# Patient Record
Sex: Female | Born: 2000
Health system: Southern US, Community
[De-identification: ages and names within clinical notes are randomized; demographics above are authoritative.]

---
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
## 2021-01-05 ENCOUNTER — Ambulatory Visit: Payer: Commercial Managed Care - PPO | Admitting: Physical Therapy

## 2021-01-05 ENCOUNTER — Encounter: Payer: Self-pay | Admitting: Physical Therapy

## 2021-01-05 ENCOUNTER — Other Ambulatory Visit: Payer: Self-pay

## 2021-01-05 DIAGNOSIS — M25571 Pain in right ankle and joints of right foot: Secondary | ICD-10-CM

## 2021-01-05 DIAGNOSIS — R600 Localized edema: Secondary | ICD-10-CM

## 2021-01-05 DIAGNOSIS — M6281 Muscle weakness (generalized): Secondary | ICD-10-CM

## 2021-01-05 NOTE — Therapy (Signed)
[facility] [address] [place], [place], [address] Phone: [phone]   Fax:  [phone]  Physical Therapy Treatment  Patient Details  Name: AQUILLA SHAMBLEY MRN: [id number] Date of Birth: December 07, 2000 Referring Provider (PT): Gabriel Rainwater, MD   Encounter Date: 01/05/2021   PT End of Session - 01/05/21 1307    Visit Number 3    Number of Visits 6    Date for PT Re-Evaluation 02/09/21    Authorization Type [employer]    PT Start Time 1304    PT Stop Time 1344    PT Time Calculation (min) 40 min    Activity Tolerance Patient tolerated treatment well    Behavior During Therapy [facility] for tasks assessed/performed           History reviewed. No pertinent past medical history.  History reviewed. No pertinent surgical history.  There were no vitals filed for this visit.   Subjective Assessment - 01/05/21 1306    Subjective COVID-19 screening performed upon arrival. Patient reports that pain and bruising around attachment of R peroneal and follows behind the lateral malleoli. Thinks that the ionto patch did help with the pain for a while but pain is pretty constant.    Pertinent History unremarkable    Limitations Standing;Walking;House hold activities;Other (comment)    How long can you stand comfortably? 10 mins    How long can you walk comfortably? 5 minutes    Diagnostic tests x-ray:    Patient Stated Goals play volleyball without pain    Currently in Pain? Yes    Pain Score 5     Pain Location Ankle    Pain Orientation Right;Lateral    Pain Descriptors / Indicators Discomfort    Pain Type Chronic pain    Pain Onset More than a month ago    Pain Frequency Constant              [facility] PT Assessment - 01/05/21 0001      Assessment   Medical Diagnosis R Ankle Question peroneal tendonitis    Referring Provider (PT) Gabriel Rainwater, MD    Next MD Visit 6-8 weeks    Prior Therapy no      Precautions   Precautions  None      Restrictions   Weight Bearing Restrictions No                         [facility] Adult PT Treatment/Exercise - 01/05/21 0001      Modalities   Modalities Vasopneumatic;Iontophoresis      Iontophoresis   Type of Iontophoresis Dexamethasone    Location R lateral ankle    Dose 1.0 ml    Time 8      Vasopneumatic   Number Minutes Vasopneumatic  10 minutes    Vasopnuematic Location  Ankle    Vasopneumatic Pressure Medium    Vasopneumatic Temperature  34/pain      Ankle Exercises: Aerobic   Recumbent Bike L4, seat 1 x10 min      Ankle Exercises: Standing   Rocker Board 2 minutes    Heel Raises Both;20 reps   3D   Toe Raise 20 reps                       PT Long Term Goals - 01/05/21 1346      PT LONG TERM GOAL #1   Title Patient will be independent with HEP  and its progression.    Time 6    Period Weeks    Status On-going      PT LONG TERM GOAL #2   Title Patient will demonstrate 8+ degrees of right ankle DF AROM to improve ability to perform functional tasks.    Time 6    Period Weeks    Status On-going      PT LONG TERM GOAL #3   Title Patient will demonstrate 5/5 right ankle MMT to improve stability during functional tasks.    Time 6    Period Weeks    Status On-going      PT LONG TERM GOAL #4   Title Patient will report ability to walk 20 minutes or greater with right ankle pain less than or equal to 2/10    Time 6    Period Weeks    Status On-going      PT LONG TERM GOAL #5   Title Patient will report ability to perform ADLs and recreational activities with right ankle pain less than or equal to 3/10.    Time 6    Period Weeks    Status On-going                 Plan - 01/05/21 1334    Clinical Impression Statement Patient presented in clinic with continued reports of R lateral ankle pain and bruising. Patient reports increased bruising in which her mother noticed it as well. Patient denied being any more active  than normal to cause the bruising. Inflammation noted at attachment of fifth metatarsal of peroneal. Patient reported a tightness of anterior ankle and ranging into anterior tibialis. Patient reports walking her neighborhood approximately one mile that does have hills with pain ranging. Normal vasopneumatic response noted following removal of the modality. Iontophoresis patch donned again per patient's report of some relief.    Personal Factors and Comorbidities Age;Fitness;Time since onset of injury/illness/exacerbation    Examination-Activity Limitations Stand;Locomotion Level    Examination-Participation Restrictions Other    Stability/Clinical Decision Making Stable/Uncomplicated    Rehab Potential Good    PT Frequency 1x / week    PT Duration 6 weeks    PT Treatment/Interventions ADLs/Self Care Home Management;Electrical Stimulation;Cryotherapy;Iontophoresis 4mg /ml Dexamethasone;Moist Heat;Ultrasound;Gait training;Stair training;Functional mobility training;Therapeutic activities;Therapeutic exercise;Balance training;Neuromuscular re-education;Manual techniques;Passive range of motion;Patient/family education;Vasopneumatic Device;Taping    PT Next Visit Plan Assess ionto response.    PT Home Exercise Plan see patient education section    Consulted and Agree with Plan of Care Patient           Patient will benefit from skilled therapeutic intervention in order to improve the following deficits and impairments:  Decreased range of motion,Difficulty walking,Decreased activity tolerance,Decreased balance,Decreased strength,Increased edema,Pain  Visit Diagnosis: Pain in right ankle and joints of right foot  Muscle weakness (generalized)  Localized edema     Problem List Patient Active Problem List   Diagnosis Date Noted  . Dysmenorrhea 09/05/2017    13/04/2017, PTA 01/05/2021, 1:48 PM  [facility] [address] [place], [place],  [place] Phone: [phone]   Fax:  [phone]  Name: ROSY ESTABROOK MRN: Smiley Houseman Date of Birth: 2001/06/04

## 2021-01-12 ENCOUNTER — Ambulatory Visit: Payer: Commercial Managed Care - PPO | Admitting: Physical Therapy

## 2021-01-12 ENCOUNTER — Encounter: Payer: Self-pay | Admitting: Physical Therapy

## 2021-01-12 ENCOUNTER — Other Ambulatory Visit: Payer: Self-pay

## 2021-01-12 DIAGNOSIS — M25571 Pain in right ankle and joints of right foot: Secondary | ICD-10-CM | POA: Diagnosis not present

## 2021-01-12 DIAGNOSIS — M6281 Muscle weakness (generalized): Secondary | ICD-10-CM

## 2021-01-12 DIAGNOSIS — R600 Localized edema: Secondary | ICD-10-CM

## 2021-01-12 NOTE — Therapy (Signed)
[facility] [address] [place], [place], [address] Phone: [phone]   Fax:  [phone]  Physical Therapy Treatment  Patient Details  Name: Christine Miller MRN: [id number] Date of Birth: Feb 18, 2001 Referring Provider (PT): Gabriel Rainwater, MD   Encounter Date: 01/12/2021   PT End of Session - 01/12/21 1304    Visit Number 4    Number of Visits 6    Date for PT Re-Evaluation 02/09/21    Authorization Type [employer]    PT Start Time 1301    PT Stop Time 1343    PT Time Calculation (min) 42 min    Activity Tolerance Patient tolerated treatment well    Behavior During Therapy [facility] for tasks assessed/performed           History reviewed. No pertinent past medical history.  History reviewed. No pertinent surgical history.  There were no vitals filed for this visit.   Subjective Assessment - 01/12/21 1302    Subjective COVID-19 screening performed upon arrival. Patient reports somewhat less R ankle pain today. Had more pain last week and over the weekend after hitting her ankle on the side of the register at work.    Pertinent History unremarkable    Limitations Standing;Walking;House hold activities;Other (comment)    How long can you stand comfortably? 10 mins    How long can you walk comfortably? 5 minutes    Diagnostic tests x-ray:    Patient Stated Goals play volleyball without pain    Currently in Pain? Yes    Pain Score 4     Pain Location Ankle    Pain Orientation Right;Lateral    Pain Descriptors / Indicators Discomfort    Pain Type Chronic pain    Pain Onset More than a month ago    Pain Frequency Constant              [facility] PT Assessment - 01/12/21 0001      Assessment   Medical Diagnosis R Ankle Question peroneal tendonitis    Referring Provider (PT) Gabriel Rainwater, MD    Next MD Visit 6-8 weeks    Prior Therapy no      Precautions   Precautions None      Restrictions   Weight Bearing  Restrictions No                         [facility] Adult PT Treatment/Exercise - 01/12/21 0001      Modalities   Modalities Vasopneumatic;Iontophoresis      Iontophoresis   Type of Iontophoresis Dexamethasone    Location R lateral ankle    Dose 1.0 ml    Time 8      Vasopneumatic   Number Minutes Vasopneumatic  10 minutes    Vasopnuematic Location  Ankle    Vasopneumatic Pressure Low    Vasopneumatic Temperature  34/pain      Ankle Exercises: Aerobic   Recumbent Bike L4, seat 1 x10 min      Ankle Exercises: Standing   Rocker Board 2 minutes    Heel Raises Both;20 reps   3D   Toe Raise 20 reps      Ankle Exercises: Seated   ABC's 1 rep   0.5# ankle isolator   Other Seated Ankle Exercises R ankle isolator 0.5# circles, squares x20 reps each      Ankle Exercises: Supine   Other Supine Ankle Exercises SL R ankle inversion, eversion 0.5# ankle  isolator x10 reps each                       PT Long Term Goals - 01/05/21 1346      PT LONG TERM GOAL #1   Title Patient will be independent with HEP and its progression.    Time 6    Period Weeks    Status On-going      PT LONG TERM GOAL #2   Title Patient will demonstrate 8+ degrees of right ankle DF AROM to improve ability to perform functional tasks.    Time 6    Period Weeks    Status On-going      PT LONG TERM GOAL #3   Title Patient will demonstrate 5/5 right ankle MMT to improve stability during functional tasks.    Time 6    Period Weeks    Status On-going      PT LONG TERM GOAL #4   Title Patient will report ability to walk 20 minutes or greater with right ankle pain less than or equal to 2/10    Time 6    Period Weeks    Status On-going      PT LONG TERM GOAL #5   Title Patient will report ability to perform ADLs and recreational activities with right ankle pain less than or equal to 3/10.    Time 6    Period Weeks    Status On-going                 Plan - 01/12/21 1332     Clinical Impression Statement Patient presented in clinic with reports of somewhat less R ankle pain. 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 Patient states that bruising along lateral R ankle around malleoli is still present. Patient progressed to more lightly resisted strengthening exercises but reported greater pain with inversion/eversion motions especially eversion. Weakness noted with R ankle eversion during therex session. Normal vasopneumatic response noted following removal of the modality. Iontophoresis patch donned again to R lateral ankle to assist in reducing inflammation.    Personal Factors and Comorbidities Age;Fitness;Time since onset of injury/illness/exacerbation    Examination-Activity Limitations Stand;Locomotion Level    Examination-Participation Restrictions Other    Stability/Clinical Decision Making Stable/Uncomplicated    Rehab Potential Good    PT Frequency 1x / week    PT Duration 6 weeks    PT Treatment/Interventions ADLs/Self Care Home Management;Electrical Stimulation;Cryotherapy;Iontophoresis 4mg /ml Dexamethasone;Moist Heat;Ultrasound;Gait training;Stair training;Functional mobility training;Therapeutic activities;Therapeutic exercise;Balance training;Neuromuscular re-education;Manual techniques;Passive range of motion;Patient/family education;Vasopneumatic Device;Taping    PT Next Visit Plan Assess ionto response.    PT Home Exercise Plan see patient education section    Consulted and Agree with Plan of Care Patient           Patient will benefit from skilled therapeutic intervention in order to improve the following deficits and impairments:  Decreased range of motion,Difficulty walking,Decreased activity tolerance,Decreased balance,Decreased strength,Increased edema,Pain  Visit Diagnosis: Pain in right ankle and joints of right foot  Muscle weakness (generalized)  Localized edema     Problem List Patient  Active Problem List   Diagnosis Date Noted  . Dysmenorrhea 09/05/2017    13/04/2017, PTA 01/12/2021, 1:44 PM  [facility] [address] [place], [place], [place] Phone: [phone]   Fax:  [phone]  Name: CHESSICA AUDIA MRN: Smiley Houseman Date of Birth: 06-21-2001

## 2021-01-19 ENCOUNTER — Other Ambulatory Visit: Payer: Self-pay

## 2021-01-19 ENCOUNTER — Ambulatory Visit: Payer: Commercial Managed Care - PPO | Admitting: Physical Therapy

## 2021-01-19 ENCOUNTER — Encounter: Payer: Self-pay | Admitting: Physical Therapy

## 2021-01-19 DIAGNOSIS — M25571 Pain in right ankle and joints of right foot: Secondary | ICD-10-CM | POA: Diagnosis not present

## 2021-01-19 DIAGNOSIS — M6281 Muscle weakness (generalized): Secondary | ICD-10-CM

## 2021-01-19 DIAGNOSIS — R600 Localized edema: Secondary | ICD-10-CM

## 2021-01-19 NOTE — Therapy (Signed)
[facility] [address] [place], [place], [address] Phone: [phone]   Fax:  [phone]  Physical Therapy Treatment  Patient Details  Name: Christine Miller MRN: [id number] Date of Birth: Jan 31, 2001 Referring Provider (PT): Gabriel Rainwater, MD   Encounter Date: 01/19/2021   PT End of Session - 01/19/21 1315    Visit Number 5    Number of Visits 6    Date for PT Re-Evaluation 02/09/21    Authorization Type [employer]    PT Start Time 1306    PT Stop Time 1353    PT Time Calculation (min) 47 min    Activity Tolerance Patient tolerated treatment well    Behavior During Therapy [facility] for tasks assessed/performed           History reviewed. No pertinent past medical history.  History reviewed. No pertinent surgical history.  There were no vitals filed for this visit.   Subjective Assessment - 01/19/21 1314    Subjective COVID-19 screening performed upon arrival. Patient reports 6/10 pain in right ankle. States she was all bruised up over the weekend due to an unknown cause.    Pertinent History unremarkable    Limitations Standing;Walking;House hold activities;Other (comment)    How long can you stand comfortably? 10 mins    How long can you walk comfortably? 5 minutes    Diagnostic tests x-ray:    Patient Stated Goals play volleyball without pain    Currently in Pain? Yes    Pain Score 6     Pain Location Ankle    Pain Orientation Right;Lateral    Pain Descriptors / Indicators Discomfort              [facility] PT Assessment - 01/19/21 0001      Assessment   Medical Diagnosis R Ankle Question peroneal tendonitis    Referring Provider (PT) Gabriel Rainwater, MD    Next MD Visit 6-8 weeks    Prior Therapy no      Precautions   Precautions None      Restrictions   Weight Bearing Restrictions No                         [facility] Adult PT Treatment/Exercise - 01/19/21 0001      Modalities   Modalities  Vasopneumatic;Iontophoresis      Iontophoresis   Type of Iontophoresis Dexamethasone    Location R lateral ankle    Dose 1.0 ml    Time 8      Vasopneumatic   Number Minutes Vasopneumatic  10 minutes    Vasopnuematic Location  Ankle    Vasopneumatic Pressure Low    Vasopneumatic Temperature  34/pain      Ankle Exercises: Aerobic   Recumbent Bike L4, seat 1 x10 min      Ankle Exercises: Standing   Rocker Board 4 minutes   AP and lateral x2 mins each   Heel Raises Both;20 reps   3D off the step for muscle elongation     Ankle Exercises: Sidelying   Ankle Inversion Strengthening;Right;20 reps   ankle isolator 0.5 lbs   Ankle Eversion Strengthening;Right;20 reps   ankle islator 0.5 lbs                      PT Long Term Goals - 01/05/21 1346      PT LONG TERM GOAL #1   Title Patient will be independent with HEP  and its progression.    Time 6    Period Weeks    Status On-going      PT LONG TERM GOAL #2   Title Patient will demonstrate 8+ degrees of right ankle DF AROM to improve ability to perform functional tasks.    Time 6    Period Weeks    Status On-going      PT LONG TERM GOAL #3   Title Patient will demonstrate 5/5 right ankle MMT to improve stability during functional tasks.    Time 6    Period Weeks    Status On-going      PT LONG TERM GOAL #4   Title Patient will report ability to walk 20 minutes or greater with right ankle pain less than or equal to 2/10    Time 6    Period Weeks    Status On-going      PT LONG TERM GOAL #5   Title Patient will report ability to perform ADLs and recreational activities with right ankle pain less than or equal to 3/10.    Time 6    Period Weeks    Status On-going                 Plan - 01/19/21 1321    Clinical Impression Statement Patient arrives to physical therapy with ongoing 6/10 right ankle pain. Patient was able to tolerate treatment fairly well with minimal reports of increased pain. Patient  noted with weakness with resisted ankle eversion and required rest breaks secondary to fatigue. Patient and PT discussed calling MD follow up appointment this week as next week is her last visit in plan of care. No adverse affect upon removal of vasopneumatic device. Iontophoresis patch donned with strong carryover of removal time.    Personal Factors and Comorbidities Age;Fitness;Time since onset of injury/illness/exacerbation    Examination-Activity Limitations Stand;Locomotion Level    Examination-Participation Restrictions Other    Stability/Clinical Decision Making Stable/Uncomplicated    Clinical Decision Making Low    Rehab Potential Good    PT Frequency 1x / week    PT Duration 6 weeks    PT Treatment/Interventions ADLs/Self Care Home Management;Electrical Stimulation;Cryotherapy;Iontophoresis 4mg /ml Dexamethasone;Moist Heat;Ultrasound;Gait training;Stair training;Functional mobility training;Therapeutic activities;Therapeutic exercise;Balance training;Neuromuscular re-education;Manual techniques;Passive range of motion;Patient/family education;Vasopneumatic Device;Taping    PT Next Visit Plan review goals, MD note, patient to be placed on hold until MD follow up.    PT Home Exercise Plan see patient education section    Consulted and Agree with Plan of Care Patient           Patient will benefit from skilled therapeutic intervention in order to improve the following deficits and impairments:  Decreased range of motion,Difficulty walking,Decreased activity tolerance,Decreased balance,Decreased strength,Increased edema,Pain  Visit Diagnosis: Pain in right ankle and joints of right foot  Muscle weakness (generalized)  Localized edema     Problem List Patient Active Problem List   Diagnosis Date Noted  . Dysmenorrhea 09/05/2017    13/04/2017, PT, DPT 01/19/2021, 2:05 PM  [facility] [address] [place], [place],  [place] Phone: [phone]   Fax:  [phone]  Name: Christine Miller MRN: Smiley Houseman Date of Birth: Jan 25, 2001

## 2021-01-26 ENCOUNTER — Ambulatory Visit: Payer: Commercial Managed Care - PPO | Admitting: Physical Therapy

## 2021-01-26 ENCOUNTER — Other Ambulatory Visit: Payer: Self-pay

## 2021-01-26 DIAGNOSIS — M6281 Muscle weakness (generalized): Secondary | ICD-10-CM

## 2021-01-26 DIAGNOSIS — M25571 Pain in right ankle and joints of right foot: Secondary | ICD-10-CM

## 2021-01-26 DIAGNOSIS — R600 Localized edema: Secondary | ICD-10-CM

## 2021-01-26 NOTE — Therapy (Signed)
[facility] [place], [place], [address] Phone: [phone]   Fax:  [phone]  Physical Therapy Treatment  Patient Details  Name: Christine Miller MRN: [id number] Date of Birth: 12-08-2000 Referring Provider (PT): Harriet Pho, MD   Encounter Date: 01/26/2021   PT End of Session - 01/26/21 1316    Visit Number 6    Number of Visits 6    Date for PT Re-Evaluation 02/09/21    Authorization Type [employer]    PT Start Time 1300    PT Stop Time 1348    PT Time Calculation (min) 48 min    Activity Tolerance Patient tolerated treatment well    Behavior During Therapy [facility] for tasks assessed/performed           No past medical history on file.  No past surgical history on file.  There were no vitals filed for this visit.   Subjective Assessment - 01/26/21 1314    Subjective COVID-19 screening performed upon arrival. Patient reports ongoing 6/10 pain in right ankle; reports pain is more localized to bruising location.    Pertinent History unremarkable    Limitations Standing;Walking;House hold activities;Other (comment)    How long can you stand comfortably? 10 mins    How long can you walk comfortably? 5 minutes    Diagnostic tests x-ray:    Patient Stated Goals play volleyball without pain    Currently in Pain? Yes    Pain Score 6     Pain Location Ankle    Pain Orientation Right;Lateral    Pain Descriptors / Indicators Discomfort    Pain Type Chronic pain    Pain Onset More than a month ago    Pain Frequency Constant              [facility] PT Assessment - 01/26/21 0001      Assessment   Medical Diagnosis R Ankle Question peroneal tendonitis    Referring Provider (PT) Harriet Pho, MD    Next MD Visit 6-8 weeks    Prior Therapy no      Precautions   Precautions None      Restrictions   Weight Bearing Restrictions No      AROM   Overall AROM Comments (+) with DF and Eversion    Right Ankle  Dorsiflexion 10    Right Ankle Plantar Flexion 55    Right Ankle Inversion 50    Right Ankle Eversion 20      Strength   Right Ankle Dorsiflexion 4+/5    Right Ankle Plantar Flexion 5/5    Right Ankle Inversion 4/5    Right Ankle Eversion 4/5                         [facility] Adult PT Treatment/Exercise - 01/26/21 0001      Modalities   Modalities Vasopneumatic;Iontophoresis      Iontophoresis   Type of Iontophoresis Dexamethasone    Location R lateral ankle    Dose 1.0 ml    Time 8      Vasopneumatic   Number Minutes Vasopneumatic  10 minutes    Vasopnuematic Location  Ankle    Vasopneumatic Pressure Low    Vasopneumatic Temperature  34/pain      Ankle Exercises: Aerobic   Recumbent Bike L4, seat 1 x10 min      Ankle Exercises: Standing   Rocker Board Other (comment);3 minutes   3 mins,  AP and lateral   Other Standing Ankle Exercises Balance beam lateral stepping x3 minutes; tandem walking x3 minutes                  PT Education - 01/26/21 1433    Education Details Visit    [email or web address]    Access Code: [email or web address]               PT Long Term Goals - 01/26/21 1340      PT LONG TERM GOAL #1   Title Patient will be independent with HEP and its progression.    Time 6    Period Weeks    Status Achieved      PT LONG TERM GOAL #2   Title Patient will demonstrate 8+ degrees of right ankle DF AROM to improve ability to perform functional tasks.    Time 6    Period Weeks    Status Achieved      PT LONG TERM GOAL #3   Title Patient will demonstrate 5/5 right ankle MMT to improve stability during functional tasks.    Time 6    Period Weeks    Status Partially Met      PT LONG TERM GOAL #4   Title Patient will report ability to walk 20 minutes or greater with right ankle pain less than or equal to 2/10    Time 6    Period Weeks    Status Partially Met      PT LONG TERM GOAL #5   Title Patient will report ability to perform  ADLs and recreational activities with right ankle pain less than or equal to 3/10.    Time 6    Period Weeks    Status Partially Met                 Plan - 01/26/21 1414    Clinical Impression Statement Patient responded fairly well to therapy session though with some more pain with TEs. Patient instructed to work within discomfort levels but not beyond pain. Patient required intermittent UE support with balance activities and reported more discomfort in lateral aspect of R ankle with tandem walking than lateral stepping. Patient has achieved goal for ankle DF AROM though other goals were partially met secondary to ongoing pain. Patient to see MD for follow up next week. No adverse affects upon removal of modalities.    Personal Factors and Comorbidities Age;Fitness;Time since onset of injury/illness/exacerbation    Examination-Activity Limitations Stand;Locomotion Level    Examination-Participation Restrictions Other    Stability/Clinical Decision Making Stable/Uncomplicated    Clinical Decision Making Low    Rehab Potential Good    PT Frequency 1x / week    PT Duration 6 weeks    PT Treatment/Interventions ADLs/Self Care Home Management;Electrical Stimulation;Cryotherapy;Iontophoresis 52m/ml Dexamethasone;Moist Heat;Ultrasound;Gait training;Stair training;Functional mobility training;Therapeutic activities;Therapeutic exercise;Balance training;Neuromuscular re-education;Manual techniques;Passive range of motion;Patient/family education;Vasopneumatic Device;Taping    PT Next Visit Plan patient to be placed on hold until MD follow up.    PT Home Exercise Plan see patient education section    Consulted and Agree with Plan of Care Patient           Patient will benefit from skilled therapeutic intervention in order to improve the following deficits and impairments:  Decreased range of motion,Difficulty walking,Decreased activity tolerance,Decreased balance,Decreased strength,Increased  edema,Pain  Visit Diagnosis: Pain in right ankle and joints of right foot  Muscle weakness (generalized)  Localized edema     Problem List Patient  Active Problem List   Diagnosis Date Noted  . Dysmenorrhea 09/05/2017    Gabriela Eves, PT, DPT 01/26/2021, 2:36 PM  [facility] [address] [place], [place], [address] Phone: [phone]   Fax:  [phone]  Name: BEATRIZ QUINTELA MRN: [id number] Date of Birth: 05-04-2001

## 2021-02-15 ENCOUNTER — Ambulatory Visit
Admission: RE | Admit: 2021-02-15 | Discharge: 2021-02-15 | Disposition: A | Discharge status: Home / Self Care | Payer: Commercial Managed Care - PPO | Source: Ambulatory Visit | Attending: Orthopaedic Surgery | Admitting: Orthopaedic Surgery

## 2021-02-15 ENCOUNTER — Other Ambulatory Visit: Payer: Self-pay | Admitting: Orthopaedic Surgery

## 2021-02-15 ENCOUNTER — Other Ambulatory Visit: Payer: Self-pay

## 2021-02-15 DIAGNOSIS — M25571 Pain in right ankle and joints of right foot: Secondary | ICD-10-CM

## 2021-04-04 ENCOUNTER — Telehealth: Payer: Self-pay | Admitting: Family Medicine

## 2021-04-07 ENCOUNTER — Encounter: Payer: Self-pay | Admitting: Nurse Practitioner

## 2021-04-07 ENCOUNTER — Ambulatory Visit (INDEPENDENT_AMBULATORY_CARE_PROVIDER_SITE_OTHER): Payer: 59 | Admitting: Nurse Practitioner

## 2021-04-07 ENCOUNTER — Other Ambulatory Visit: Payer: Self-pay

## 2021-04-07 DIAGNOSIS — Z0189 Encounter for other specified special examinations: Secondary | ICD-10-CM

## 2021-04-07 DIAGNOSIS — Z8616 Personal history of COVID-19: Secondary | ICD-10-CM

## 2021-04-07 NOTE — Telephone Encounter (Signed)
Note printed and placed at nursing station A.

## 2021-04-07 NOTE — Progress Notes (Signed)
   Virtual Visit  Note Due to COVID-19 pandemic this visit was conducted virtually. This visit type was conducted due to national recommendations for restrictions regarding the COVID-19 Pandemic (e.g. social distancing, sheltering in place) in an effort to limit this patient's exposure and mitigate transmission in our community. All issues noted in this document were discussed and addressed.  A physical exam was not performed with this format.  I connected with Christine Miller on 04/07/21 at 11:55 by telephone and verified that I am speaking with the correct person using two identifiers. Christine Miller is currently located at home and no one is currently with her during visit. The provider, Mary-Margaret Daphine Deutscher, FNP is located in their office at time of visit.  I discussed the limitations, risks, security and privacy concerns of performing an evaluation and management service by telephone and the availability of in person appointments. I also discussed with the patient that there may be a patient responsible charge related to this service. The patient expressed understanding and agreed to proceed.   History and Present Illness:   Chief Complaint: No chief complaint on file.   HPI Patient tested positive for covid on May 9th. She is feeling fine. She is leaving to go to the [place] next week. On travel restrictions it says if she gets a note form her PCP that she tested positive for covid in the last 90 days then she does not have to retest to come back.    Review of Systems  Constitutional:  Negative for diaphoresis and weight loss.  Eyes:  Negative for blurred vision, double vision and pain.  Respiratory:  Negative for shortness of breath.   Cardiovascular:  Negative for chest pain, palpitations, orthopnea and leg swelling.  Gastrointestinal:  Negative for abdominal pain.  Skin:  Negative for rash.  Neurological:  Negative for dizziness, sensory change, loss of consciousness,  weakness and headaches.  Endo/Heme/Allergies:  Negative for polydipsia. Does not bruise/bleed easily.  Psychiatric/Behavioral:  Negative for memory loss. The patient does not have insomnia.   All other systems reviewed and are negative.   Observations/Objective: Alert and oriented- answers all questions appropriately No distress   Assessment and Plan: Christine Miller in today with chief complaint of Covid Exposure   1. History of COVID-19 Note written -patient will pick up     Follow Up Instructions: prn    I discussed the assessment and treatment plan with the patient. The patient was provided an opportunity to ask questions and all were answered. The patient agreed with the plan and demonstrated an understanding of the instructions.   The patient was advised to call back or seek an in-person evaluation if the symptoms worsen or if the condition fails to improve as anticipated.  The above assessment and management plan was discussed with the patient. The patient verbalized understanding of and has agreed to the management plan. Patient is aware to call the clinic if symptoms persist or worsen. Patient is aware when to return to the clinic for a follow-up visit. Patient educated on when it is appropriate to go to the emergency department.   Time call ended:  12:05  I provided 10 minutes of  non face-to-face time during this encounter.    Mary-Margaret Daphine Deutscher, FNP

## 2021-04-07 NOTE — Telephone Encounter (Signed)
Left detailed message.   

## 2021-10-26 ENCOUNTER — Other Ambulatory Visit: Payer: Self-pay | Admitting: Family Medicine

## 2021-10-26 DIAGNOSIS — N946 Dysmenorrhea, unspecified: Secondary | ICD-10-CM

## 2021-12-01 ENCOUNTER — Other Ambulatory Visit: Payer: Self-pay | Admitting: Family Medicine

## 2021-12-01 DIAGNOSIS — N946 Dysmenorrhea, unspecified: Secondary | ICD-10-CM

## 2021-12-08 ENCOUNTER — Encounter: Payer: Commercial Managed Care - PPO | Admitting: Family Medicine

## 2022-01-10 ENCOUNTER — Other Ambulatory Visit: Payer: Self-pay | Admitting: Family Medicine

## 2022-01-10 DIAGNOSIS — N946 Dysmenorrhea, unspecified: Secondary | ICD-10-CM

## 2022-03-08 ENCOUNTER — Encounter: Payer: 59 | Admitting: Family Medicine

## 2022-04-18 ENCOUNTER — Ambulatory Visit: Payer: 59 | Admitting: Family Medicine

## 2022-04-18 ENCOUNTER — Encounter: Payer: Self-pay | Admitting: Family Medicine

## 2022-04-18 VITALS — BP 116/68 | HR 95 | Temp 97.9°F | Ht 62.05 in | Wt 130.0 lb

## 2022-04-18 DIAGNOSIS — N946 Dysmenorrhea, unspecified: Secondary | ICD-10-CM | POA: Diagnosis not present

## 2022-04-18 DIAGNOSIS — Z0000 Encounter for general adult medical examination without abnormal findings: Secondary | ICD-10-CM

## 2022-04-18 DIAGNOSIS — Z0001 Encounter for general adult medical examination with abnormal findings: Secondary | ICD-10-CM | POA: Diagnosis not present

## 2022-04-18 MED ORDER — DESOGESTREL-ETHINYL ESTRADIOL 0.15-0.02/0.01 MG (21/5) PO TABS: 1.0000 | ORAL_TABLET | Freq: Every day | ORAL | 3 refills | Status: AC

## 2022-04-18 NOTE — Progress Notes (Signed)
Assessment & Plan:  Well adult exam Discussed health benefits of physical activity, and encouraged her to engage in regular exercise appropriate for her age and condition. Preventive health education provided. Patient declined hepatitis C/HIV screening and COVID vaccines.  Immunization History  Administered Date(s) Administered   DTaP 07/17/2002, 11/05/2002, 04/01/2003, 10/06/2003, 04/09/2007   Hepatitis A, Ped/Adol-2 Dose 09/16/2018, 03/18/2019   Hepatitis B 03/10/02, 06/25/2002, 04/01/2003   HiB (PRP-OMP) 07/17/2002, 11/05/2002, 04/01/2003, 10/06/2003   IPV 07/17/2002, 02/04/2003, 10/06/2003, 04/09/2007   Influenza Nasal 08/24/2008, 09/29/2008   MMR 10/06/2003, 04/09/2007   Meningococcal B, OMV 09/16/2018, 10/16/2018   Meningococcal Conjugate 07/17/2014   Meningococcal Mcv4o 09/16/2018   Moderna Sars-Covid-2 Vaccination 06/14/2020   Tdap 04/18/2013   Health Maintenance  Topic Date Due   HPV VACCINES (1 - 2-dose series) Never done   COVID-19 Vaccine (2 - Moderna series) 05/04/2022 (Originally 08/09/2020)   Hepatitis C Screening  04/19/2023 (Originally 05/13/2020)   HIV Screening  04/19/2023 (Originally 05/13/2017)   INFLUENZA VACCINE  05/30/2022   TETANUS/TDAP  04/19/2023   2. Dysmenorrhea Well controlled on current regimen.  - desogestrel-ethinyl estradiol (VOLNEA) 0.15-0.02/0.01 MG (21/5) tablet; Take 1 tablet by mouth daily.  Dispense: 84 tablet; Refill: 3   Follow-up: Return in about 1 year (around 04/19/2023) for annual physical.   Hendricks Limes, MSN, APRN, FNP-C [facility]  Subjective:  Patient ID: Christine Miller, female    DOB: 29-Apr-2001  Age: 21 y.o. MRN: [id number]  Patient Care Team: Loman Brooklyn, FNP as PCP - General (Family Medicine)   CC:  Chief Complaint  Patient presents with   Annual Exam    HPI Christine Miller is a 21 y.o. female who presents today for a complete physical exam. She reports consuming a general diet. Gym/  health club routine includes weights. She generally feels well. She reports sleeping well. She does not have additional problems to discuss today.   Vision:Within last year Dental:No regular dental care  DEPRESSION SCREENING    04/18/2022    9:12 AM 12/07/2020    1:15 PM 11/18/2018   12:19 PM 09/16/2018    3:50 PM 09/05/2017    3:39 PM  PHQ 2/9 Scores  PHQ - 2 Score 0 0 0 0 0  PHQ- 9 Score 0  0 0 0     Review of Systems  Constitutional:  Negative for chills, fever, malaise/fatigue and weight loss.  HENT:  Negative for congestion, ear discharge, ear pain, nosebleeds, sinus pain, sore throat and tinnitus.   Eyes:  Negative for blurred vision, double vision, pain, discharge and redness.  Respiratory:  Negative for cough, shortness of breath and wheezing.   Cardiovascular:  Negative for chest pain, palpitations and leg swelling.  Gastrointestinal:  Negative for abdominal pain, constipation, diarrhea, heartburn, nausea and vomiting.  Genitourinary:  Negative for dysuria, frequency and urgency.  Musculoskeletal:  Negative for myalgias.  Skin:  Negative for rash.  Neurological:  Negative for dizziness, seizures, weakness and headaches.  Psychiatric/Behavioral:  Negative for depression, substance abuse and suicidal ideas. The patient is not nervous/anxious.      Current Outpatient Medications:    desogestrel-ethinyl estradiol (VOLNEA) 0.15-0.02/0.01 MG (21/5) tablet, Take 1 tablet by mouth daily., Disp: 84 tablet, Rfl: 3  No Known Allergies  History reviewed. No pertinent past medical history.  History reviewed. No pertinent surgical history.  Family History  Problem Relation Age of Onset   Hypertension Mother    Thyroid disease Mother  Hypertension Father     Social History   Socioeconomic History   Marital status: Single    Spouse name: Not on file   Number of children: Not on file   Years of education: Not on file   Highest education level: Not on file  Occupational  History   Not on file  Tobacco Use   Smoking status: Never   Smokeless tobacco: Never  Vaping Use   Vaping Use: Never used  Substance and Sexual Activity   Alcohol use: No   Drug use: No   Sexual activity: Never  Other Topics Concern   Not on file  Social History Narrative   Not on file   Social Determinants of Health   Financial Resource Strain: Not on file  Food Insecurity: Not on file  Transportation Needs: Not on file  Physical Activity: Not on file  Stress: Not on file  Social Connections: Not on file  Intimate Partner Violence: Not on file      Objective:    BP 116/68   Pulse 95   Temp 97.9 F (36.6 C)   Ht 5' 2.05" (1.576 m)   Wt 130 lb (59 kg)   LMP 03/21/2022   SpO2 100%   BMI 23.74 kg/m     Physical Exam Vitals reviewed.  Constitutional:      General: She is not in acute distress.    Appearance: Normal appearance. She is normal weight. She is not ill-appearing, toxic-appearing or diaphoretic.  HENT:     Head: Normocephalic and atraumatic.     Right Ear: Tympanic membrane, ear canal and external ear normal. There is no impacted cerumen.     Left Ear: Tympanic membrane, ear canal and external ear normal. There is no impacted cerumen.     Nose: Nose normal. No congestion or rhinorrhea.     Mouth/Throat:     Mouth: Mucous membranes are moist.     Pharynx: Oropharynx is clear. No oropharyngeal exudate or posterior oropharyngeal erythema.  Eyes:     General: No scleral icterus.       Right eye: No discharge.        Left eye: No discharge.     Conjunctiva/sclera: Conjunctivae normal.     Pupils: Pupils are equal, round, and reactive to light.  Cardiovascular:     Rate and Rhythm: Normal rate and regular rhythm.     Heart sounds: Normal heart sounds. No murmur heard.    No friction rub. No gallop.  Pulmonary:     Effort: Pulmonary effort is normal. No respiratory distress.     Breath sounds: Normal breath sounds. No stridor. No wheezing, rhonchi  or rales.  Abdominal:     General: Abdomen is flat. Bowel sounds are normal. There is no distension.     Palpations: Abdomen is soft. There is no hepatomegaly, splenomegaly or mass.     Tenderness: There is no abdominal tenderness. There is no guarding or rebound.     Hernia: No hernia is present.  Musculoskeletal:        General: Normal range of motion.     Cervical back: Normal range of motion and neck supple. No rigidity. No muscular tenderness.  Lymphadenopathy:     Cervical: No cervical adenopathy.  Skin:    General: Skin is warm and dry.     Capillary Refill: Capillary refill takes less than 2 seconds.  Neurological:     General: No focal deficit present.     Mental  Status: She is alert and oriented to person, place, and time. Mental status is at baseline.  Psychiatric:        Mood and Affect: Mood normal.        Behavior: Behavior normal.        Thought Content: Thought content normal.        Judgment: Judgment normal.     No results found for: "TSH" Lab Results  Component Value Date   WBC 5.6 11/18/2018   HGB 12.9 11/18/2018   HCT 38.1 11/18/2018   MCV 89 11/18/2018   PLT 233 11/18/2018   Lab Results  Component Value Date   NA 140 11/18/2018   K 4.2 11/18/2018   CO2 21 11/18/2018   GLUCOSE 83 11/18/2018   BUN 8 11/18/2018   CREATININE 0.71 11/18/2018   BILITOT 0.3 11/18/2018   ALKPHOS 78 11/18/2018   AST 18 11/18/2018   ALT 35 (H) 11/18/2018   PROT 6.9 11/18/2018   ALBUMIN 4.4 11/18/2018   CALCIUM 9.4 11/18/2018   No results found for: "CHOL" No results found for: "HDL" No results found for: "LDLCALC" No results found for: "TRIG" No results found for: "CHOLHDL" No results found for: "HGBA1C"

## 2023-03-17 IMAGING — CT CT ANKLE*R* W/O CM
2 series · 16 of 27 positions shown, 20 images · non-contrast
Comparison: MRI 02/07/2021

CLINICAL DATA: Lateral foot and ankle symptoms since August 2019.
Volleyball injury. Evaluate for possible hindfoot coalition.

EXAM:
CT OF THE RIGHT ANKLE WITHOUT CONTRAST
TECHNIQUE: Multidetector CT imaging of the right ankle was performed according
to the standard protocol. Multiplanar CT image reconstructions were
also generated.

[Series 4: soft tissue lower extremity · axial · 0.34mm/px · z∈[-290,-170]mm · 11 of 72 slices shown, 14 images]
[im 6/72  soft-tissue]
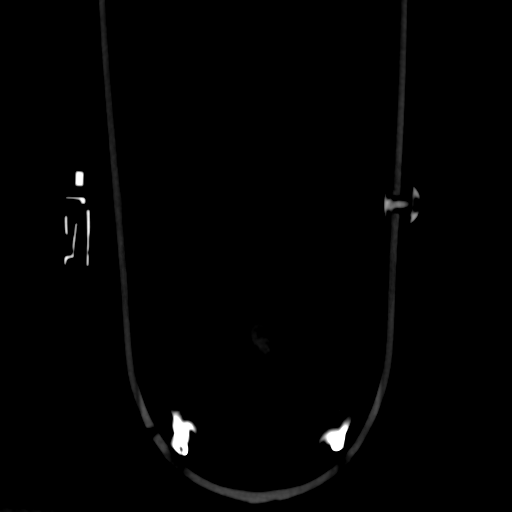
[im 6/72  bone]
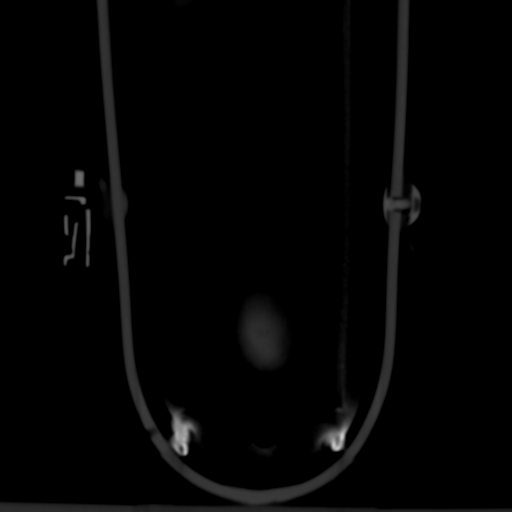
[im 11/72  bone]
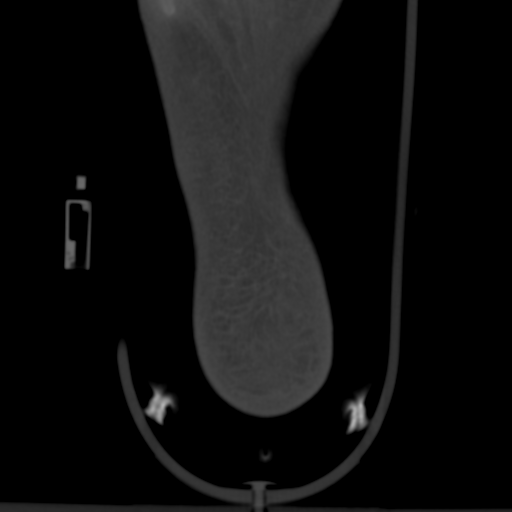
[im 17/72  bone]
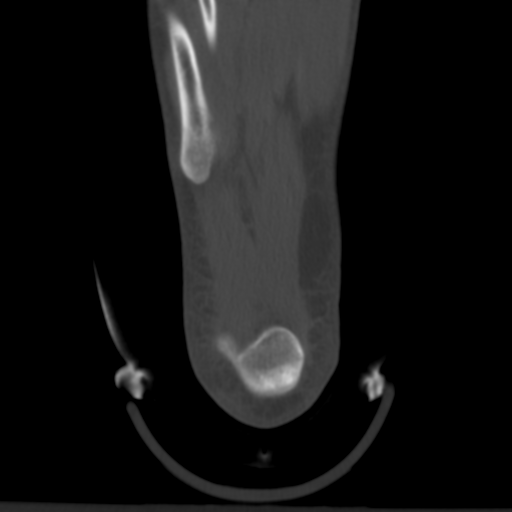
[im 22/72  bone]
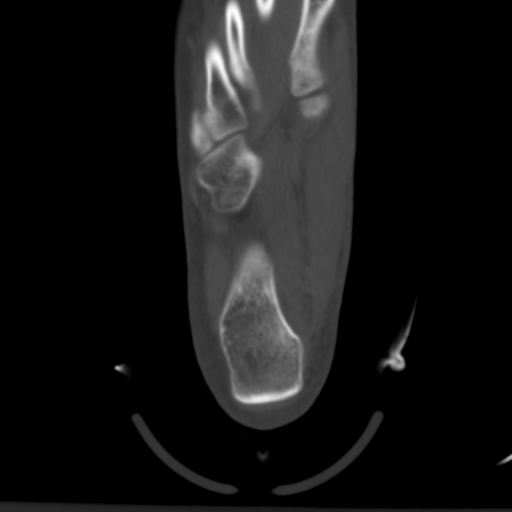
[im 28/72  soft-tissue]
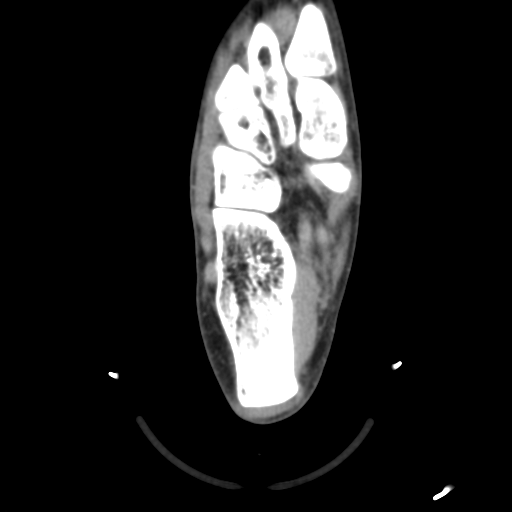
[im 28/72  bone]
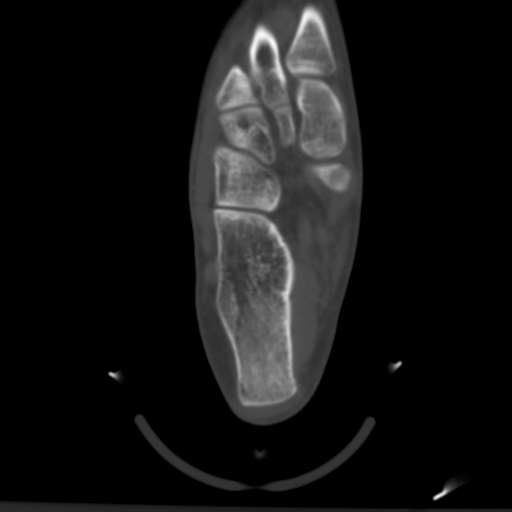
[im 39/72  bone]
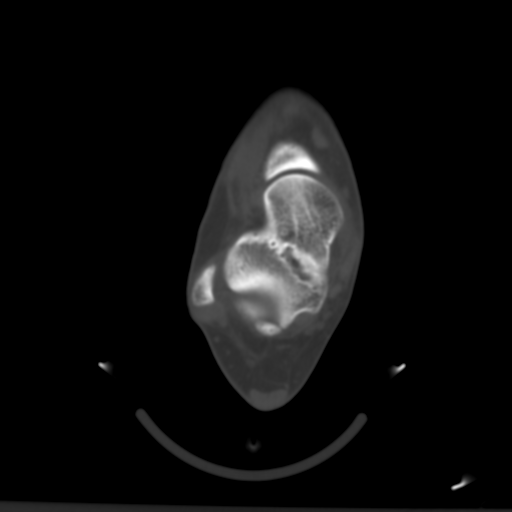
[im 44/72  bone]
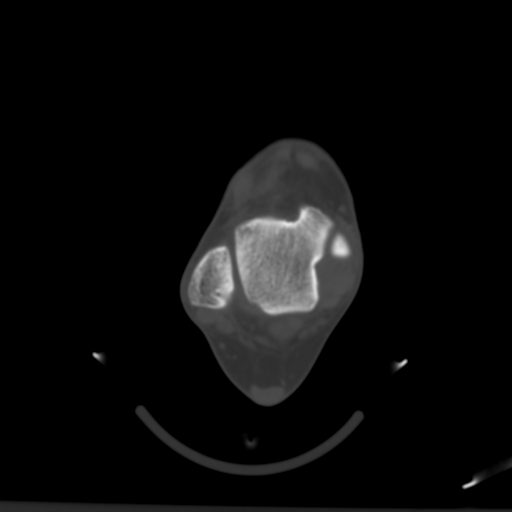
[im 50/72  bone]
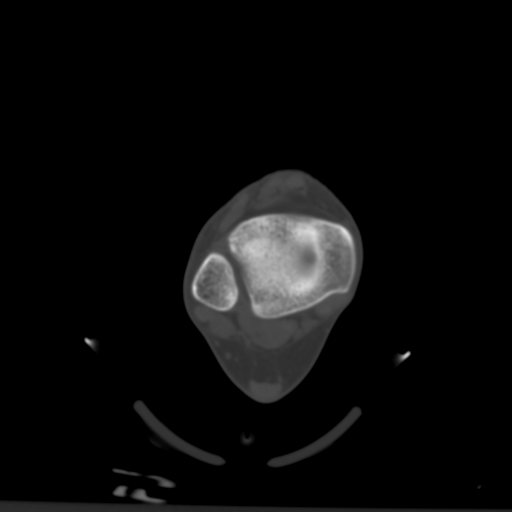
[im 55/72  soft-tissue]
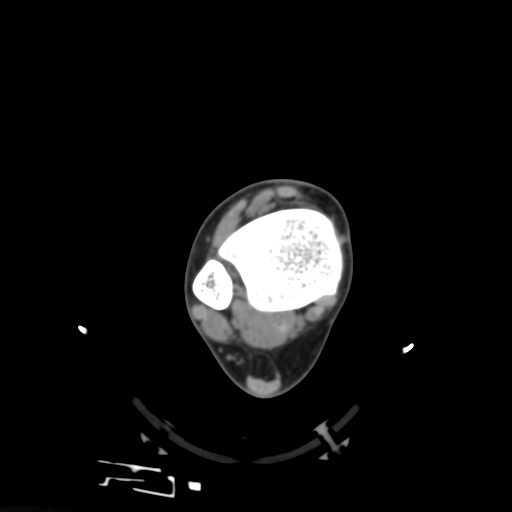
[im 55/72  bone]
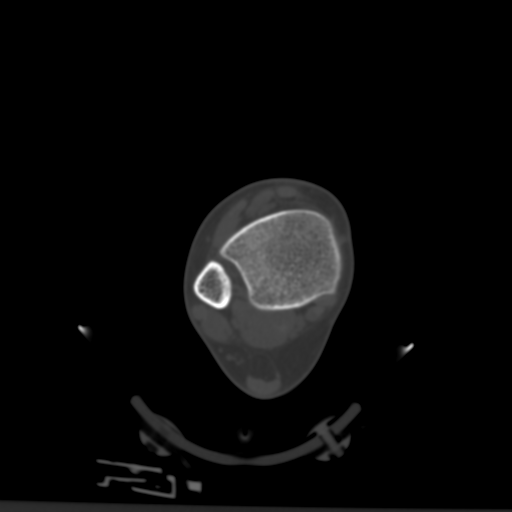
[im 61/72  bone]
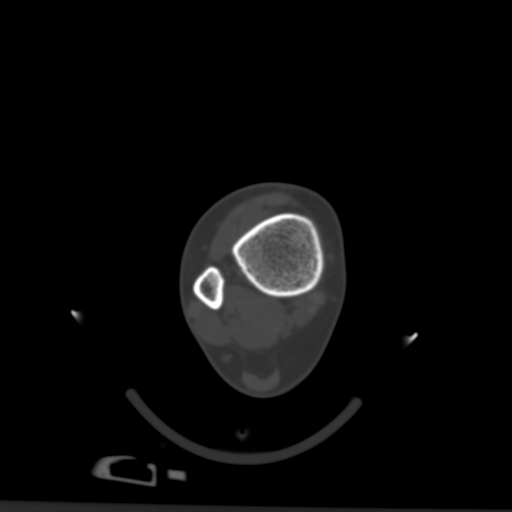
[im 66/72  bone]
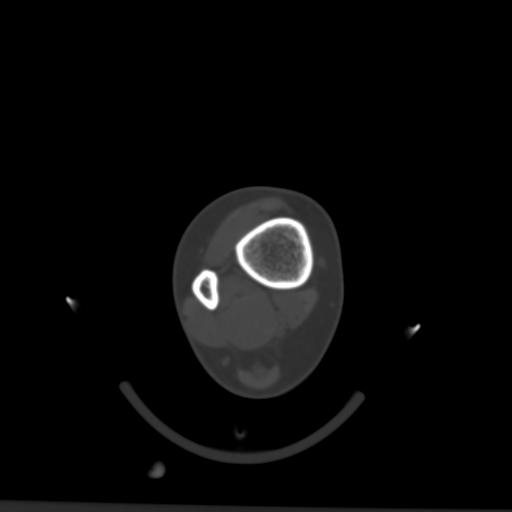

[Series 10: sagsoft tissue · sagittal · 0.29mm/px · 5 of 48 slices shown, 6 images]
[im 16/48  bone]
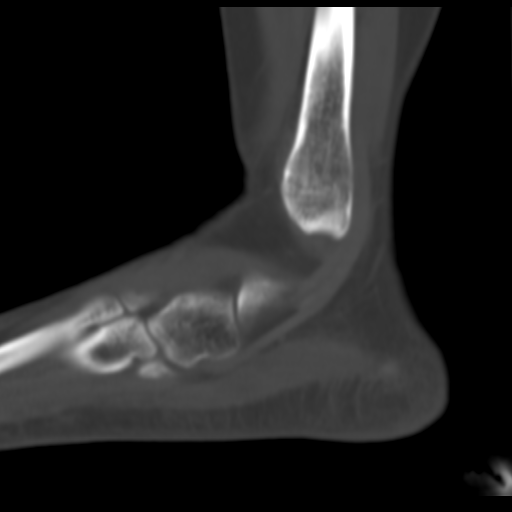
[im 20/48  bone]
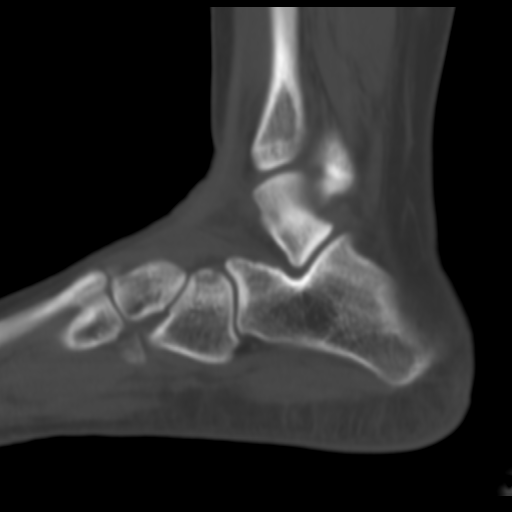
[im 24/48  soft-tissue]
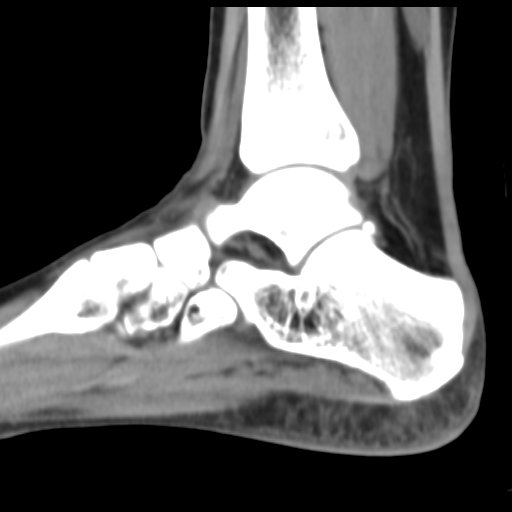
[im 24/48  bone]
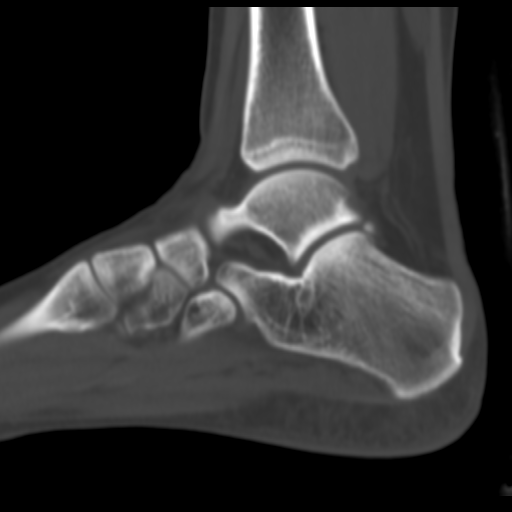
[im 28/48  bone]
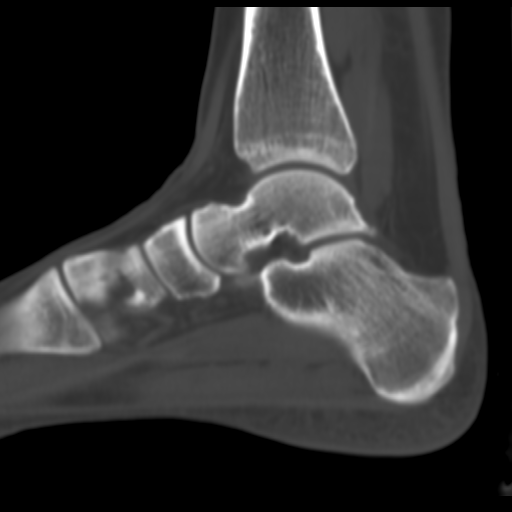
[im 32/48  bone]
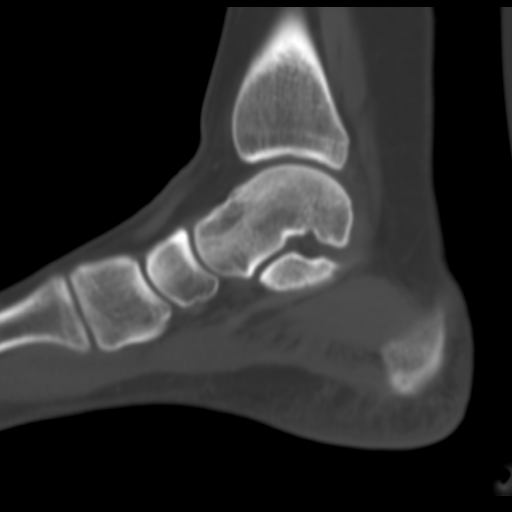

[16 of 27 positions shown; findings below may reference images not displayed]

FINDINGS: The tibiotalar and subtalar joint spaces are maintained. No evidence
of stress fracture or AVN. Small os trigonum is noted. The sinus
tarsi is unremarkable. The cervical and interosseous ligaments are
intact.

No findings suspicious for tarsal coalition. The midfoot bony
structures are intact and the joint spaces are maintained. No
degenerative, cystic or erosive findings.

Grossly by CT the major ankle ligaments and tendons are intact.
IMPRESSION: Unremarkable CT examination of the right ankle. No findings
suspicious for tarsal coalition.
